# Patient Record
Sex: Male | Born: 1985 | Race: White | Hispanic: No | Marital: Single | Smoking: Current every day smoker
Health system: Southern US, Community
[De-identification: ages and names within clinical notes are randomized; demographics above are authoritative.]

## PROBLEM LIST (undated history)

## (undated) ENCOUNTER — Emergency Department (HOSPITAL_COMMUNITY): Admission: EM | Disposition: A | Discharge status: Home / Self Care | Payer: Self-pay

---

## 2009-05-19 ENCOUNTER — Encounter: Payer: Self-pay | Admitting: Emergency Medicine

## 2009-05-20 ENCOUNTER — Inpatient Hospital Stay (HOSPITAL_COMMUNITY): Admission: EM | Admit: 2009-05-20 | Discharge: 2009-05-20 | Payer: Self-pay | Admitting: Emergency Medicine

## 2009-05-23 ENCOUNTER — Emergency Department (HOSPITAL_COMMUNITY): Admission: EM | Admit: 2009-05-23 | Discharge: 2009-05-24 | Payer: Self-pay | Admitting: Emergency Medicine

## 2009-07-18 ENCOUNTER — Emergency Department (HOSPITAL_COMMUNITY): Admission: EM | Admit: 2009-07-18 | Discharge: 2009-07-19 | Payer: Self-pay | Admitting: Emergency Medicine

## 2010-12-09 LAB — URINALYSIS, ROUTINE W REFLEX MICROSCOPIC
Bilirubin Urine: NEGATIVE
Nitrite: NEGATIVE
Specific Gravity, Urine: <1.005 — ABNORMAL LOW (ref 1.005–1.030)
Urobilinogen, UA: 0.2 mg/dL (ref 0.0–1.0)

## 2010-12-09 LAB — CBC
Hemoglobin: 17.4 g/dL — ABNORMAL HIGH (ref 13.0–17.0)
MCHC: 35.3 g/dL (ref 30.0–36.0)
RBC: 5.02 MIL/uL (ref 4.22–5.81)

## 2010-12-09 LAB — COMPREHENSIVE METABOLIC PANEL
ALT: 20 U/L (ref 0–53)
Alkaline Phosphatase: 50 U/L (ref 39–117)
CO2: 31 mEq/L (ref 19–32)
Calcium: 9.6 mg/dL (ref 8.4–10.5)
GFR calc non Af Amer: >60 mL/min (ref >60)
Glucose, Bld: 93 mg/dL (ref 70–99)
Sodium: 139 mEq/L (ref 135–145)
Total Bilirubin: 0.5 mg/dL (ref 0.3–1.2)

## 2010-12-09 LAB — DIFFERENTIAL
Basophils Absolute: 0.1 10*3/uL (ref 0.0–0.1)
Basophils Relative: 1 % (ref 0–1)
Eosinophils Absolute: 0.1 10*3/uL (ref 0.0–0.7)
Lymphs Abs: 3.7 10*3/uL (ref 0.7–4.0)
Neutrophils Relative %: 45 % (ref 43–77)

## 2010-12-09 LAB — RAPID URINE DRUG SCREEN, HOSP PERFORMED
Barbiturates: NOT DETECTED
Opiates: NOT DETECTED
Tetrahydrocannabinol: POSITIVE — AB

## 2011-03-04 IMAGING — CT CT HEAD W/O CM
1 of 2 series · 13 of 30 positions shown, 17 images · non-contrast
Comparison: 05/20/2009

CLINICAL DATA: Trauma.  Syncopal episodes.  Severe headaches.

CT HEAD WITHOUT CONTRAST
TECHNIQUE: Contiguous axial images were obtained from the base of
the skull through the vertex without contrast.

[Series 2: brain · axial · 0.47mm/px · z∈[+153,+265]mm · 13 of 40 slices shown, 17 images]
[im 3/40  brain]
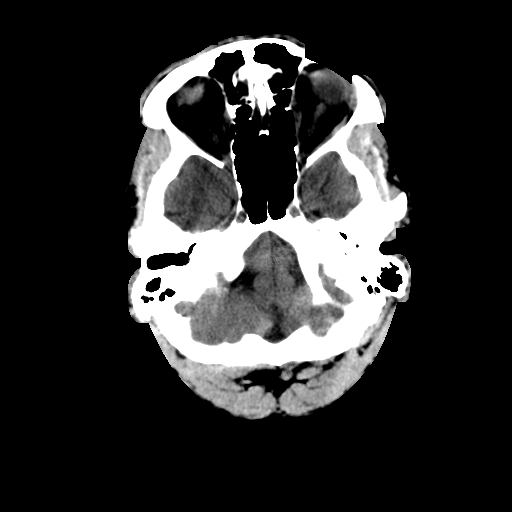
[im 3/40  bone]
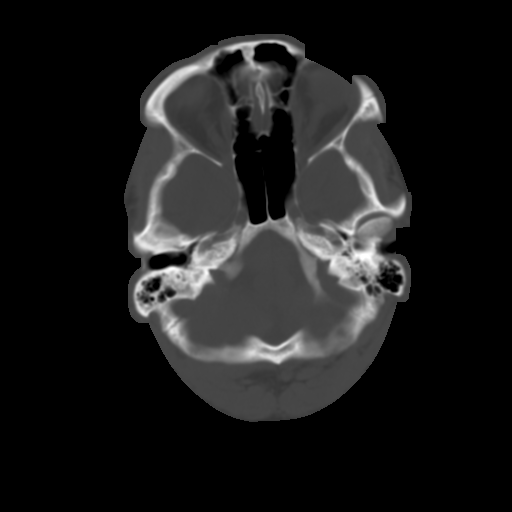
[im 6/40  brain]
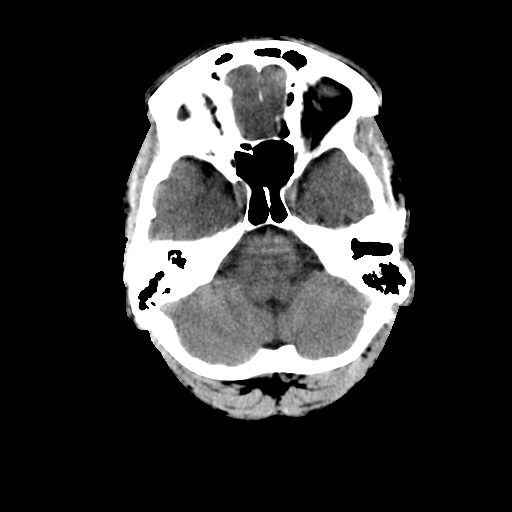
[im 9/40  brain]
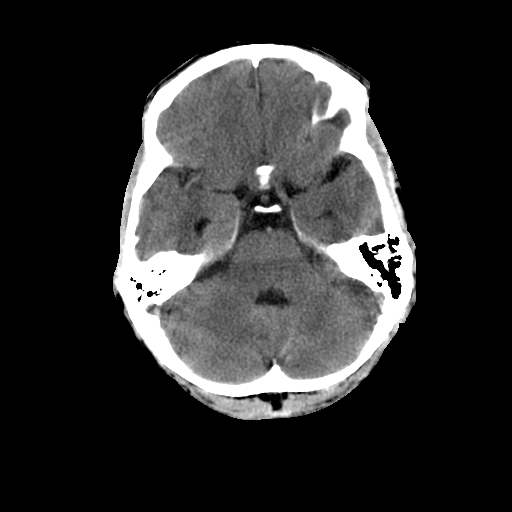
[im 12/40  brain]
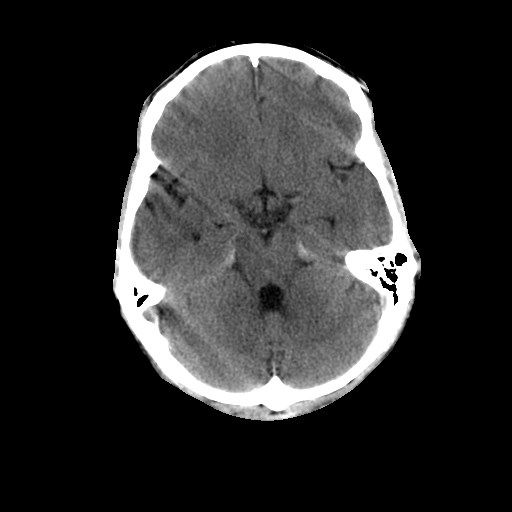
[im 14/40  brain]
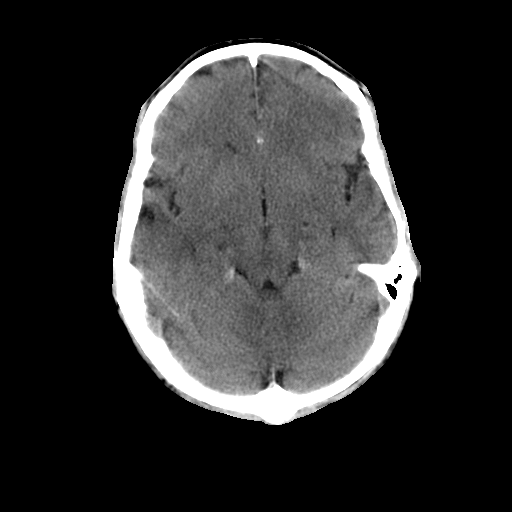
[im 14/40  bone]
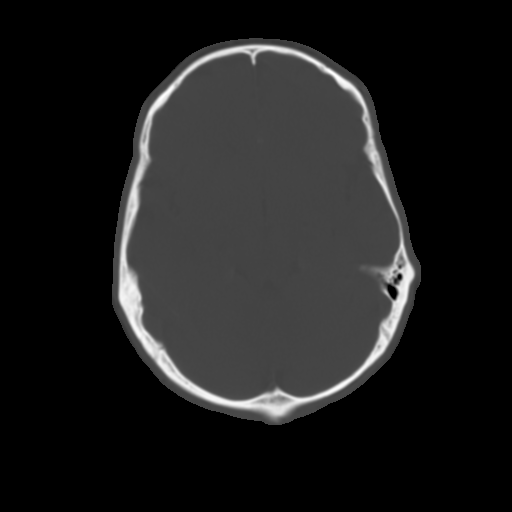
[im 17/40  brain]
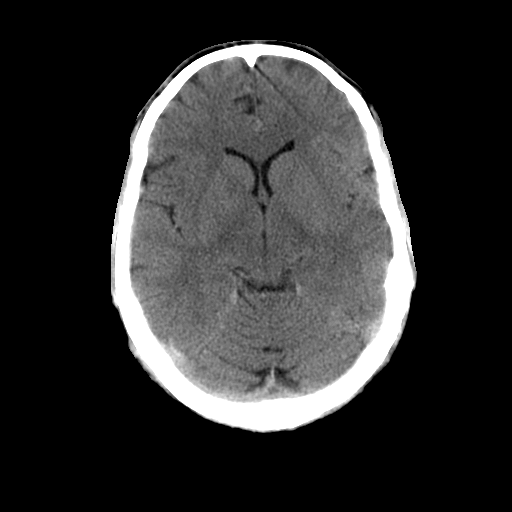
[im 20/40  brain]
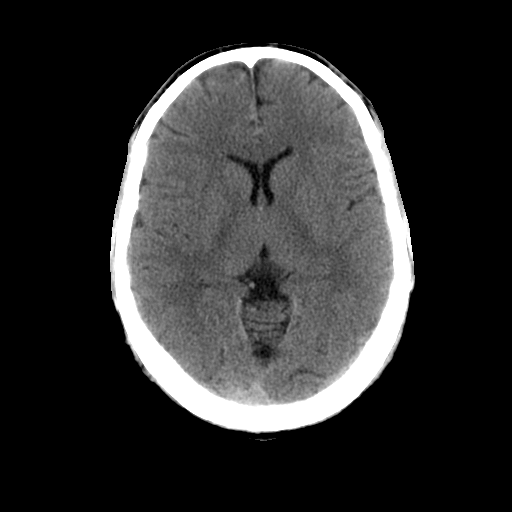
[im 23/40  brain]
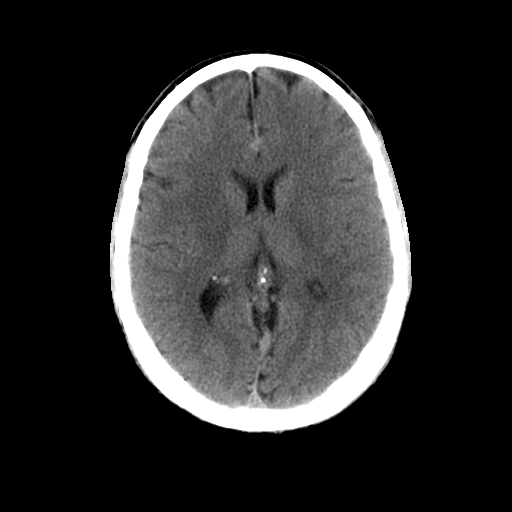
[im 26/40  brain]
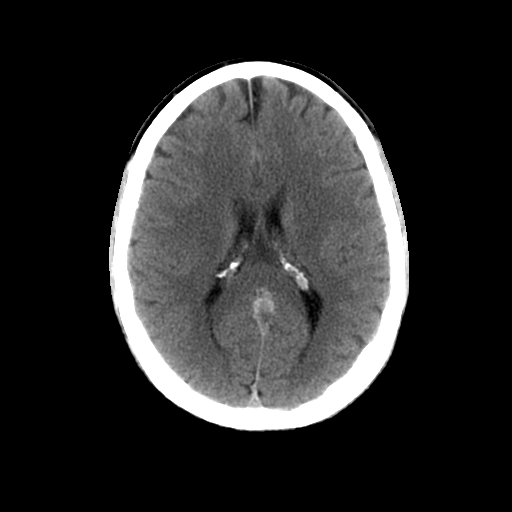
[im 26/40  bone]
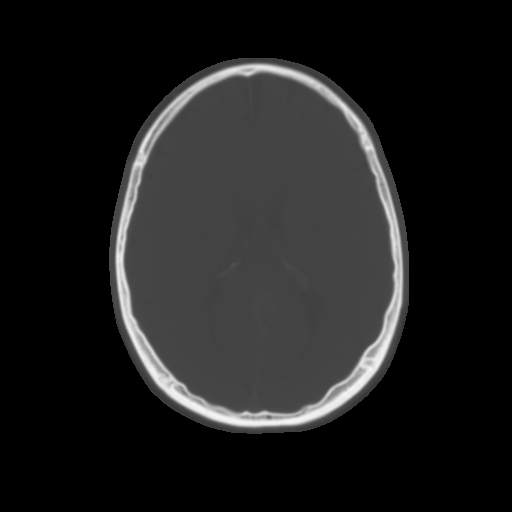
[im 28/40  brain]
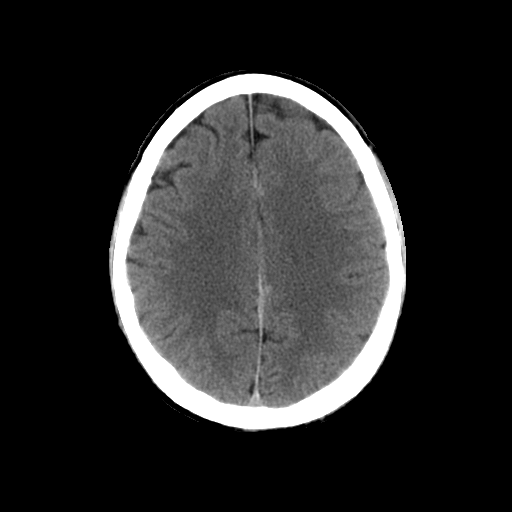
[im 31/40  brain]
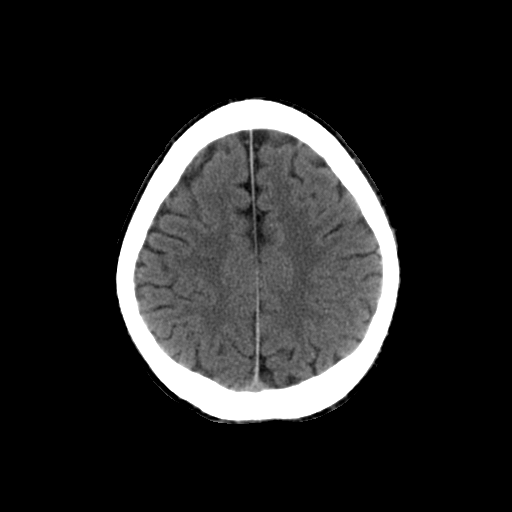
[im 34/40  brain]
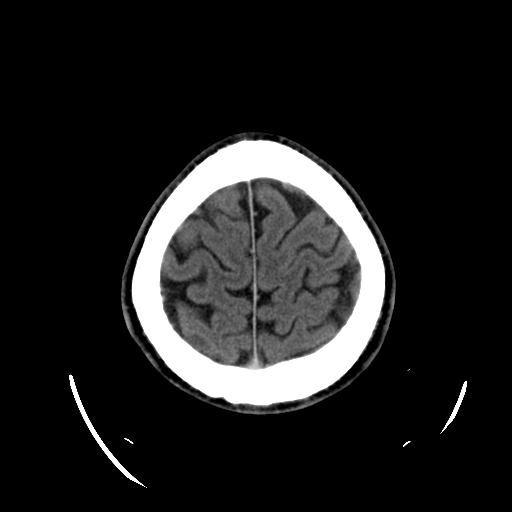
[im 37/40  brain]
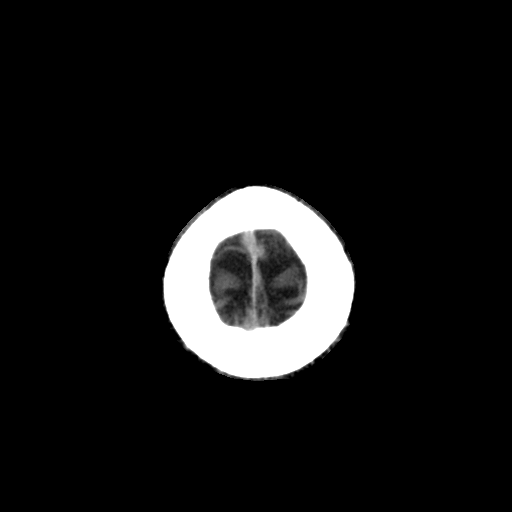
[im 37/40  bone]
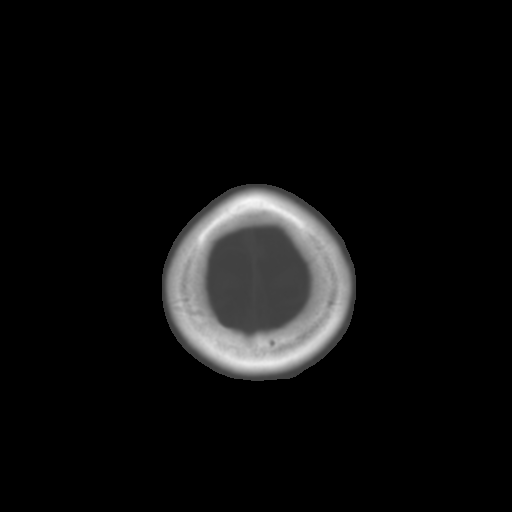

[13 of 30 positions shown; findings below may reference images not displayed]

FINDINGS: The study is mildly degraded by patient motion.  No acute
intracranial abnormalities are present.  Specifically, there is no
evidence for acute infarct, hemorrhage, mass, hydrocephalus, or
extra-axial fluid collection.  There is no significant change in
nodular densities along the anterior cerebral falx.

The paranasal sinuses and mastoid air cells are clear.  The osseous
skull is intact.
IMPRESSION: 1.  Stable nodular densities along the anterior cerebral falx.
Given the stability over time, this may represent prominent to
anterior cerebral artery is rather than blood.  Blood is still not
excluded.
2.  No acute intracranial abnormality or significant interval
change.

## 2021-11-02 ENCOUNTER — Other Ambulatory Visit: Payer: Self-pay

## 2021-11-02 ENCOUNTER — Ambulatory Visit (INDEPENDENT_AMBULATORY_CARE_PROVIDER_SITE_OTHER): Payer: 59 | Admitting: Surgery

## 2021-11-02 ENCOUNTER — Encounter: Payer: Self-pay | Admitting: Surgery

## 2021-11-02 VITALS — BP 128/84 | HR 112 | Temp 97.9°F | Resp 18 | Ht 66.0 in | Wt 147.0 lb

## 2021-11-02 DIAGNOSIS — K6289 Other specified diseases of anus and rectum: Secondary | ICD-10-CM | POA: Diagnosis not present

## 2021-11-02 MED ORDER — AMERICAINE 20 % RE OINT: TOPICAL_OINTMENT | RECTAL | 0 refills | Status: AC | PRN

## 2021-11-02 NOTE — Progress Notes (Signed)
[facility] History and Physical ? ?Reason for Referral: Perianal cyst ?Referring Physician: Bailey Mech, PA ? ?Chief Complaint   ?New Patient (Initial Visit) ?  ? ? ?Richard Mccarty is a 36 y.o. male.  ?HPI: Patient presents with a perianal cyst.  He states that the cyst has been there for at least 2 years, but over the last year it has progressively been enlarging and getting more tender.  He presented to the ED 2 days ago, at which time he underwent a CT of the abdomen and pelvis which demonstrated this exophytic cystic lesion near the junction of the anus and his skin.  At that time, the ED physicians referred him to general surgery for evaluation.  He denies any significant drainage from the area, though he does note a follow-up couple drops of blood on his underwear.  He has significant tenderness associated with the area.  He denies any previous history of hemorrhoids or any other cyst anywhere else on his body.  He denies significant surgical history.  He denies use of blood thinning medications.  He smokes a pack per day of cigarettes and marijuana, and occasionally drinks alcohol. ? ?No past medical history on file. ? ?No family history on file. ? ?Social History  ? ?Tobacco Use  ? Smoking status: Every Day  ?  Types: Cigarettes  ?  Passive exposure: Current  ? Smokeless tobacco: Never  ?Substance Use Topics  ? Alcohol use: Yes  ? ? ?Medications: I have reviewed the patient's current medications. ?Allergies as of 11/02/2021   ?No Known Allergies ?  ? ?  ?Medication List  ?  ? ?  ? Accurate as of November 02, 2021  4:02 PM. If you have any questions, ask your nurse or doctor.  ?  ?  ? ?  ? ?Americaine 20 % rectal ointment ?Generic drug: benzocaine ?Place rectally every 3 (three) hours as needed for pain. ?Started by: Lewie Chamber, DO ?  ?oxyCODONE 5 MG immediate release tablet ?Commonly known as: Oxy IR/ROXICODONE ?Take by mouth. ?  ? ?  ? ? ? ?ROS:  ?Constitutional: negative  for chills, fatigue, and fevers ?Eyes: negative for visual disturbance and pain ?Ears, nose, mouth, throat, and face: negative for ear drainage, sore throat, and sinus problems ?Respiratory: Positive for wheezing, negative for cough, and shortness of breath ?Cardiovascular: negative for chest pain and palpitations ?Gastrointestinal: negative for abdominal pain, nausea, reflux symptoms, and vomiting ?Genitourinary:negative for dysuria, frequency, and urinary retention ?Integument/breast: negative for dryness and rash ?Hematologic/lymphatic: negative for bleeding and lymphadenopathy ?Musculoskeletal:negative for back pain, neck pain, and joint pain ?Neurological: negative for dizziness, tremors, and numbness ?Endocrine: negative for temperature intolerance ? ?Blood pressure 128/84, pulse (!) 112, temperature 97.9 ?F (36.6 ?C), temperature source Other (Comment), resp. rate 18, height 5\' 6"  (1.676 m), weight 147 lb (66.7 kg), SpO2 95 %. ?Physical Exam ?Vitals reviewed.  ?Constitutional:   ?   Appearance: Normal appearance.  ?HENT:  ?   Head: Normocephalic and atraumatic.  ?Eyes:  ?   Extraocular Movements: Extraocular movements intact.  ?   Pupils: Pupils are equal, round, and reactive to light.  ?Cardiovascular:  ?   Rate and Rhythm: Regular rhythm. Tachycardia present.  ?Pulmonary:  ?   Effort: Pulmonary effort is normal.  ?Abdominal:  ?   Palpations: Abdomen is soft.  ?   Tenderness: There is no abdominal tenderness.  ?Genitourinary: ?   Comments: Enlarged exophytic cyst like structure at the 4 o'clock  position, small scab over lesion and small area of ischemia, tender to palpation, appears to arise from the anodermal junction ?Musculoskeletal:     ?   General: Normal range of motion.  ?   Cervical back: Normal range of motion.  ?Skin: ?   General: Skin is warm and dry.  ?Neurological:  ?   General: No focal deficit present.  ?   Mental Status: He is alert and oriented to person, place, and time.  ?Psychiatric:      ?   Mood and Affect: Mood normal.     ?   Behavior: Behavior normal.  ? ? ?Results: ?No results found for this or any previous visit (from the past 48 hour(s)). ? ?No results found. ? ? ?Assessment & Plan:  ?Richard Mccarty is a 36 y.o. male who presents for evaluation of a perianal cyst. ? ?-Given patient's significant tenderness of the associated cyst, recommended anal exam under anesthesia with excision of the cyst structure ?-I explained that I am unsure if this is a perianal cyst versus hemorrhoid versus some other lesion, but we can excise the area and sent to pathology for evaluation ?-The risks and benefits of anal exam under anesthesia with excision of cyst were discussed, including but not limited to bleeding, infection, injury to surrounding structures, fecal incontinence.  After careful consideration, Hosteen Kienast has decided to proceed with this procedure. ?-Patient scheduled for anal exam under anesthesia with excision of cyst on 3/3 ?-Prescription given for Americaine ointment to apply over area to alleviate some of his associated tenderness ? ?All questions were answered to the satisfaction of the patient. ? ? ?Taitum Alms, DO ?[facility] ?[address] ?[place], [place] [address] ?[phone] (office) ? ? ? ? ? ?

## 2021-11-02 NOTE — H&P (Signed)
[facility] History and Physical ? ?Reason for Referral: Perianal cyst ?Referring Physician: Bailey Mech, PA ? ?Chief Complaint   ?New Patient (Initial Visit) ?  ? ? ?Richard Mccarty is a 36 y.o. male.  ?HPI: Patient presents with a perianal cyst.  He states that the cyst has been there for at least 2 years, but over the last year it has progressively been enlarging and getting more tender.  He presented to the ED 2 days ago, at which time he underwent a CT of the abdomen and pelvis which demonstrated this exophytic cystic lesion near the junction of the anus and his skin.  At that time, the ED physicians referred him to general surgery for evaluation.  He denies any significant drainage from the area, though he does note a follow-up couple drops of blood on his underwear.  He has significant tenderness associated with the area.  He denies any previous history of hemorrhoids or any other cyst anywhere else on his body.  He denies significant surgical history.  He denies use of blood thinning medications.  He smokes a pack per day of cigarettes and marijuana, and occasionally drinks alcohol. ? ?No past medical history on file. ? ?No family history on file. ? ?Social History  ? ?Tobacco Use  ? Smoking status: Every Day  ?  Types: Cigarettes  ?  Passive exposure: Current  ? Smokeless tobacco: Never  ?Substance Use Topics  ? Alcohol use: Yes  ? ? ?Medications: I have reviewed the patient's current medications. ?Allergies as of 11/02/2021   ?No Known Allergies ?  ? ?  ?Medication List  ?  ? ?  ? Accurate as of November 02, 2021  4:02 PM. If you have any questions, ask your nurse or doctor.  ?  ?  ? ?  ? ?Americaine 20 % rectal ointment ?Generic drug: benzocaine ?Place rectally every 3 (three) hours as needed for pain. ?Started by: Lewie Chamber, DO ?  ?oxyCODONE 5 MG immediate release tablet ?Commonly known as: Oxy IR/ROXICODONE ?Take by mouth. ?  ? ?  ? ? ? ?ROS:  ?Constitutional: negative  for chills, fatigue, and fevers ?Eyes: negative for visual disturbance and pain ?Ears, nose, mouth, throat, and face: negative for ear drainage, sore throat, and sinus problems ?Respiratory: Positive for wheezing, negative for cough, and shortness of breath ?Cardiovascular: negative for chest pain and palpitations ?Gastrointestinal: negative for abdominal pain, nausea, reflux symptoms, and vomiting ?Genitourinary:negative for dysuria, frequency, and urinary retention ?Integument/breast: negative for dryness and rash ?Hematologic/lymphatic: negative for bleeding and lymphadenopathy ?Musculoskeletal:negative for back pain, neck pain, and joint pain ?Neurological: negative for dizziness, tremors, and numbness ?Endocrine: negative for temperature intolerance ? ?Blood pressure 128/84, pulse (!) 112, temperature 97.9 ?F (36.6 ?C), temperature source Other (Comment), resp. rate 18, height 5\' 6"  (1.676 m), weight 147 lb (66.7 kg), SpO2 95 %. ?Physical Exam ?Vitals reviewed.  ?Constitutional:   ?   Appearance: Normal appearance.  ?HENT:  ?   Head: Normocephalic and atraumatic.  ?Eyes:  ?   Extraocular Movements: Extraocular movements intact.  ?   Pupils: Pupils are equal, round, and reactive to light.  ?Cardiovascular:  ?   Rate and Rhythm: Regular rhythm. Tachycardia present.  ?Pulmonary:  ?   Effort: Pulmonary effort is normal.  ?Abdominal:  ?   Palpations: Abdomen is soft.  ?   Tenderness: There is no abdominal tenderness.  ?Genitourinary: ?   Comments: Enlarged exophytic cyst like structure at the 4 o'clock  position, small scab over lesion and small area of ischemia, tender to palpation, appears to arise from the anodermal junction ?Musculoskeletal:     ?   General: Normal range of motion.  ?   Cervical back: Normal range of motion.  ?Skin: ?   General: Skin is warm and dry.  ?Neurological:  ?   General: No focal deficit present.  ?   Mental Status: He is alert and oriented to person, place, and time.  ?Psychiatric:      ?   Mood and Affect: Mood normal.     ?   Behavior: Behavior normal.  ? ? ?Results: ?No results found for this or any previous visit (from the past 48 hour(s)). ? ?No results found. ? ? ?Assessment & Plan:  ?Richard Mccarty is a 35 y.o. male who presents for evaluation of a perianal cyst. ? ?-Given patient's significant tenderness of the associated cyst, recommended anal exam under anesthesia with excision of the cyst structure ?-I explained that I am unsure if this is a perianal cyst versus hemorrhoid versus some other lesion, but we can excise the area and sent to pathology for evaluation ?-The risks and benefits of anal exam under anesthesia with excision of cyst were discussed, including but not limited to bleeding, infection, injury to surrounding structures, fecal incontinence.  After careful consideration, Richard Mccarty has decided to proceed with this procedure. ?-Patient scheduled for anal exam under anesthesia with excision of cyst on 3/3 ?-Prescription given for Americaine ointment to apply over area to alleviate some of his associated tenderness ? ?All questions were answered to the satisfaction of the patient. ? ? ?Pride Gonzales, DO ?[facility] ?[address] ?[place], [place] [address] ?[phone] (office) ? ? ? ? ? ?

## 2021-11-03 ENCOUNTER — Telehealth (INDEPENDENT_AMBULATORY_CARE_PROVIDER_SITE_OTHER): Payer: 59 | Admitting: Surgery

## 2021-11-03 ENCOUNTER — Encounter (HOSPITAL_COMMUNITY)
Admission: RE | Admit: 2021-11-03 | Discharge: 2021-11-03 | Disposition: A | Discharge status: Home / Self Care | Payer: 59 | Source: Ambulatory Visit | Attending: Surgery | Admitting: Surgery

## 2021-11-03 ENCOUNTER — Other Ambulatory Visit: Payer: Self-pay

## 2021-11-03 ENCOUNTER — Encounter (HOSPITAL_COMMUNITY): Payer: Self-pay

## 2021-11-03 DIAGNOSIS — K6289 Other specified diseases of anus and rectum: Secondary | ICD-10-CM

## 2021-11-03 NOTE — Pre-Procedure Instructions (Signed)
Called for pre-op phone call. Patient states that, " about 0100 last night, whatever this thing is burst. Should I call the doctor and let her know?" I advised him to call her and let her know. We went over pre-op instructions and history in case he does proceed with surgery. ?

## 2021-11-03 NOTE — Telephone Encounter (Signed)
Was informed by office staff that the patient called the office and stated that his perianal mass had ruptured last night.  Called the patient to discuss.  He stated that over the few hours after he left, he felt that the area was getting bigger and the pain was unrelenting, so his mother was coming to pick him up to take him to the emergency department.  He states that after his mother arrived, he sat down and had relief of his pain.  He evaluated the mass, and states that had ruptured and had a thick white drainage that came out.  He still has the overlying skin hanging from his anus.  I discussed that I believe we should still proceed with his anal exam under anesthesia and I will plan to remove what ever remains of this ruptured cyst/mass.  We we will plan to send the remnants of the cyst/mass to pathology for evaluation and to try to prevent recurrence.  He was agreeable to this plan.  All of his questions were answered to his expressed satisfaction. ?

## 2021-11-03 NOTE — Patient Instructions (Signed)
Richard Mccarty  11/03/2021     @PREFPERIOPPHARMACY @   Your procedure is scheduled on  11/04/2021.   Report to [facility] at  1100 A.M.   Call this number if you have problems the morning of surgery:  [phone]   Remember:  Do not eat or drink after midnight.      Take these medicines the morning of surgery with A SIP OF WATER                                 oxy IR (if needed).    Do not wear jewelry, make-up or nail polish.  Do not wear lotions, powders, or perfumes, or deodorant.  Do not shave 48 hours prior to surgery.  Men may shave face and neck.  Do not bring valuables to the hospital.  [facility] is not responsible for any belongings or valuables.  Contacts, dentures or bridgework may not be worn into surgery.  Leave your suitcase in the car.  After surgery it may be brought to your room.  For patients admitted to the hospital, discharge time will be determined by your treatment team.  Patients discharged the day of surgery will not be allowed to drive home and must have someone with them for 24 hours.    Special instructions:   DO NOT smoke tobacco or vape for 24 hours before your procedure.  Please read over the following fact sheets that you were given. Coughing and Deep Breathing, Surgical Site Infection Prevention, Anesthesia Post-op Instructions, and Care and Recovery After Surgery      Incision Care, Adult An incision is a cut that a doctor makes in your skin for surgery. Most times, these cuts are closed after surgery. Your cut from surgery may be closed with: Stitches (sutures). Staples. Skin glue. Skin tape (adhesive) strips. You may need to go back to your doctor to have stitches or staples taken out. This may happen many days or many weeks after your surgery. You need to take good care of your cut so it does not get infected. Follow instructions from your doctor about how to care for your cut. Supplies needed: Soap and water. A  clean hand towel. Wound cleanser. A clean bandage (dressing), if needed. Cream or ointment, if told by your doctor. Clean gauze. How to care for your cut from surgery Cleaning your cut Ask your doctor how to clean your cut. You may need to: Wear medical gloves. Use mild soap and water, or a wound cleanser. Use a clean gauze to pat your cut dry after you clean it. Changing your bandage Wash your hands with soap and water for at least 20 seconds before and after you change your bandage. If you cannot use soap and water, use hand sanitizer. Do not usedisinfectants or antiseptics, such as rubbing alcohol, to clean your wound unless told by your doctor. Change your bandage as told by your doctor. Leavestitches or skin glue in place for at least 2 weeks. Leave tape strips alone unless you are told to take them off. You may trim the edges of the tape strips if they curl up. Put a cream or ointment on your cut. Do this only as told. Cover your cut with a clean bandage. Ask your doctor when you can leave your cut uncovered. Checking for infection Check your cut area every day for signs of infection. Check for:  More redness, swelling, or pain. More fluid or blood. New warmth. Hardness or a new rash around the incision. Pus or a bad smell.  Follow these instructions at home Medicines Take over-the-counter and prescription medicines only as told by your doctor. If you were prescribed an antibiotic medicine, cream, or ointment, use it as told by your doctor. Do not stop using the antibiotic even if you start to feel better. Eating and drinking Eat foods that have a lot of certain nutrients, such as protein, vitamin A, and vitamin C. These foods help your cut heal. Foods rich in protein include meat, fish, eggs, dairy, beans, nuts, and protein drinks. Foods rich in vitamin A include carrots and dark green, leafy vegetables. Foods rich in vitamin C include citrus fruits, tomatoes, broccoli, and  peppers. Drink enough fluid to keep your pee (urine) pale yellow. General instructions  Do not take baths, swim, or use a hot tub. Ask your doctor about taking showers or sponge baths. Limit movement around your cut. This helps with healing. Try not to strain, lift, or exercise for the first 2 weeks, or for as long as told by your doctor. Return to your normal activities as told by your doctor. Ask your doctor what activities are safe for you. Do not scratch, scrub, or pick at your cut. Keep it covered as told by your doctor. Protect your cut from the sun when you are outside for the first 6 months, or for as long as told by your doctor. Cover up the scar area or put on sunscreen that has an SPF of at least 30. Do not use any products that contain nicotine or tobacco, such as cigarettes, e-cigarettes, and chewing tobacco. These can delay cut healing. If you need help quitting, ask your doctor. Keep all follow-up visits. Contact a doctor if: You have any of these signs of infection around your cut: More redness, swelling, or pain. More fluid or blood. New warmth or hardness. Pus or a bad smell. A new rash. You have a fever. You feel like you may vomit (nauseous). You vomit. You are dizzy. Your stitches, staples, skin glue, or tape strips come undone. Your cut gets bigger. You have a fever. Get help right away if: Your cut bleeds through your bandage, and bleeding does not stop with gentle pressure. Your cut opens up and comes apart. These symptoms may be an emergency. Do not wait to see if the symptoms will go away. Get medical help right away. Call your local emergency services (911 in the [place] Do not drive yourself to the hospital. Summary Follow instructions from your doctor about how to care for your cut. Wash your hands with soap and water for at least 20 seconds before and after you change your bandage. If you cannot use soap and water, use hand sanitizer. Check your cut area  every day for signs of infection. Keep all follow-up visits. This information is not intended to replace advice given to you by your health care provider. Make sure you discuss any questions you have with your health care provider. Document Revised: 11/22/2020 Document Reviewed: 11/22/2020 Elsevier Patient Education  2022 [employer] Monitored Anesthesia Care, Care After This sheet gives you information about how to care for yourself after your procedure. Your health care provider may also give you more specific instructions. If you have problems or questions, contact your health care provider. What can I expect after the procedure? After the procedure, it is common to have: Tiredness.  Forgetfulness about what happened after the procedure. Impaired judgment for important decisions. Nausea or vomiting. Some difficulty with balance. Follow these instructions at home: For the time period you were told by your health care provider:   Rest as needed. Do not participate in activities where you could fall or become injured. Do not drive or use machinery. Do not drink alcohol. Do not take sleeping pills or medicines that cause drowsiness. Do not make important decisions or sign legal documents. Do not take care of children on your own. Eating and drinking Follow the diet that is recommended by your health care provider. Drink enough fluid to keep your urine pale yellow. If you vomit: Drink water, juice, or soup when you can drink without vomiting. Make sure you have little or no nausea before eating solid foods. General instructions Have a responsible adult stay with you for the time you are told. It is important to have someone help care for you until you are awake and alert. Take over-the-counter and prescription medicines only as told by your health care provider. If you have sleep apnea, surgery and certain medicines can increase your risk for breathing problems. Follow instructions  from your health care provider about wearing your sleep device: Anytime you are sleeping, including during daytime naps. While taking prescription pain medicines, sleeping medicines, or medicines that make you drowsy. Avoid smoking. Keep all follow-up visits as told by your health care provider. This is important. Contact a health care provider if: You keep feeling nauseous or you keep vomiting. You feel light-headed. You are still sleepy or having trouble with balance after 24 hours. You develop a rash. You have a fever. You have redness or swelling around the IV site. Get help right away if: You have trouble breathing. You have new-onset confusion at home. Summary For several hours after your procedure, you may feel tired. You may also be forgetful and have poor judgment. Have a responsible adult stay with you for the time you are told. It is important to have someone help care for you until you are awake and alert. Rest as told. Do not drive or operate machinery. Do not drink alcohol or take sleeping pills. Get help right away if you have trouble breathing, or if you suddenly become confused. This information is not intended to replace advice given to you by your health care provider. Make sure you discuss any questions you have with your health care provider. Document Revised: 05/06/2020 Document Reviewed: 07/24/2019 Elsevier Patient Education  2022 [employer].

## 2021-11-04 ENCOUNTER — Encounter (HOSPITAL_COMMUNITY)
Admission: RE | Disposition: A | Discharge status: Home / Self Care | Payer: Self-pay | Source: Home / Self Care | Attending: Surgery

## 2021-11-04 ENCOUNTER — Ambulatory Visit (HOSPITAL_COMMUNITY): Payer: 59 | Admitting: Anesthesiology

## 2021-11-04 ENCOUNTER — Other Ambulatory Visit: Payer: Self-pay

## 2021-11-04 ENCOUNTER — Encounter (HOSPITAL_COMMUNITY): Payer: Self-pay | Admitting: Surgery

## 2021-11-04 ENCOUNTER — Ambulatory Visit (HOSPITAL_COMMUNITY)
Admission: RE | Admit: 2021-11-04 | Discharge: 2021-11-04 | Disposition: A | Discharge status: Home / Self Care | Payer: 59 | Attending: Surgery | Admitting: Surgery

## 2021-11-04 ENCOUNTER — Ambulatory Visit (HOSPITAL_BASED_OUTPATIENT_CLINIC_OR_DEPARTMENT_OTHER): Payer: 59 | Admitting: Anesthesiology

## 2021-11-04 DIAGNOSIS — F17210 Nicotine dependence, cigarettes, uncomplicated: Secondary | ICD-10-CM | POA: Insufficient documentation

## 2021-11-04 DIAGNOSIS — F1290 Cannabis use, unspecified, uncomplicated: Secondary | ICD-10-CM | POA: Insufficient documentation

## 2021-11-04 DIAGNOSIS — K6289 Other specified diseases of anus and rectum: Secondary | ICD-10-CM | POA: Diagnosis not present

## 2021-11-04 DIAGNOSIS — L0591 Pilonidal cyst without abscess: Secondary | ICD-10-CM | POA: Diagnosis not present

## 2021-11-04 HISTORY — PX: MASS EXCISION: SHX2000

## 2021-11-04 SURGERY — EXAM UNDER ANESTHESIA
Anesthesia: General | Site: Rectum

## 2021-11-04 MED ORDER — BACITRACIN-NEOMYCIN-POLYMYXIN 400-5-5000 EX OINT
TOPICAL_OINTMENT | CUTANEOUS | Status: AC
  Filled 2021-11-04: qty 1

## 2021-11-04 MED ORDER — LIDOCAINE HCL (PF) 2 % IJ SOLN
INTRAMUSCULAR | Status: AC
  Filled 2021-11-04: qty 5

## 2021-11-04 MED ORDER — IBUPROFEN 200 MG PO TABS: 200.0000 mg | ORAL_TABLET | Freq: Four times a day (QID) | ORAL | 0 refills | Status: AC

## 2021-11-04 MED ORDER — METRONIDAZOLE 500 MG/100ML IV SOLN
500.0000 mg | INTRAVENOUS | Status: AC
  Administered 2021-11-04: 500 mg via INTRAVENOUS

## 2021-11-04 MED ORDER — DEXMEDETOMIDINE (PRECEDEX) IN NS 20 MCG/5ML (4 MCG/ML) IV SYRINGE
PREFILLED_SYRINGE | INTRAVENOUS | Status: DC | PRN
  Administered 2021-11-04: 8 ug via INTRAVENOUS

## 2021-11-04 MED ORDER — METRONIDAZOLE 500 MG/100ML IV SOLN
INTRAVENOUS | Status: AC
  Filled 2021-11-04: qty 100

## 2021-11-04 MED ORDER — SUCCINYLCHOLINE CHLORIDE 200 MG/10ML IV SOSY
PREFILLED_SYRINGE | INTRAVENOUS | Status: DC | PRN
  Administered 2021-11-04: 120 mg via INTRAVENOUS

## 2021-11-04 MED ORDER — ONDANSETRON HCL 4 MG/2ML IJ SOLN
INTRAMUSCULAR | Status: DC | PRN
  Administered 2021-11-04: 4 mg via INTRAVENOUS

## 2021-11-04 MED ORDER — CHLORHEXIDINE GLUCONATE CLOTH 2 % EX PADS: 6.0000 | MEDICATED_PAD | Freq: Once | CUTANEOUS | Status: DC

## 2021-11-04 MED ORDER — ONDANSETRON HCL 4 MG/2ML IJ SOLN
INTRAMUSCULAR | Status: AC
  Filled 2021-11-04: qty 4

## 2021-11-04 MED ORDER — LIDOCAINE 2% (20 MG/ML) 5 ML SYRINGE
INTRAMUSCULAR | Status: DC | PRN
  Administered 2021-11-04: 60 mg via INTRAVENOUS

## 2021-11-04 MED ORDER — ROCURONIUM BROMIDE 10 MG/ML (PF) SYRINGE
PREFILLED_SYRINGE | INTRAVENOUS | Status: AC
  Filled 2021-11-04: qty 10

## 2021-11-04 MED ORDER — ACETAMINOPHEN 500 MG PO TABS: 1000.0000 mg | ORAL_TABLET | Freq: Four times a day (QID) | ORAL | 0 refills | Status: AC

## 2021-11-04 MED ORDER — OXYCODONE HCL 5 MG PO TABS
5.0000 mg | ORAL_TABLET | Freq: Four times a day (QID) | ORAL | 0 refills | Status: AC | PRN
Start: 2021-11-04 — End: ?

## 2021-11-04 MED ORDER — METOCLOPRAMIDE HCL 5 MG/ML IJ SOLN
INTRAMUSCULAR | Status: AC
  Filled 2021-11-04: qty 2

## 2021-11-04 MED ORDER — ORAL CARE MOUTH RINSE: 15.0000 mL | Freq: Once | OROMUCOSAL | Status: AC

## 2021-11-04 MED ORDER — SUCCINYLCHOLINE CHLORIDE 200 MG/10ML IV SOSY
PREFILLED_SYRINGE | INTRAVENOUS | Status: AC
  Filled 2021-11-04: qty 10

## 2021-11-04 MED ORDER — FENTANYL CITRATE (PF) 100 MCG/2ML IJ SOLN
INTRAMUSCULAR | Status: AC
  Filled 2021-11-04: qty 2

## 2021-11-04 MED ORDER — BUPIVACAINE LIPOSOME 1.3 % IJ SUSP
INTRAMUSCULAR | Status: AC
  Filled 2021-11-04: qty 20

## 2021-11-04 MED ORDER — ALBUTEROL SULFATE HFA 108 (90 BASE) MCG/ACT IN AERS
INHALATION_SPRAY | RESPIRATORY_TRACT | Status: DC | PRN
  Administered 2021-11-04: 2 via RESPIRATORY_TRACT

## 2021-11-04 MED ORDER — SUGAMMADEX SODIUM 200 MG/2ML IV SOLN
INTRAVENOUS | Status: DC | PRN
  Administered 2021-11-04: 200 mg via INTRAVENOUS

## 2021-11-04 MED ORDER — MIDAZOLAM HCL 5 MG/5ML IJ SOLN
INTRAMUSCULAR | Status: DC | PRN
  Administered 2021-11-04: 2 mg via INTRAVENOUS

## 2021-11-04 MED ORDER — BUPIVACAINE LIPOSOME 1.3 % IJ SUSP
INTRAMUSCULAR | Status: DC | PRN
  Administered 2021-11-04: 20 mL

## 2021-11-04 MED ORDER — DEXMEDETOMIDINE (PRECEDEX) IN NS 20 MCG/5ML (4 MCG/ML) IV SYRINGE
PREFILLED_SYRINGE | INTRAVENOUS | Status: AC
  Filled 2021-11-04: qty 5

## 2021-11-04 MED ORDER — DEXAMETHASONE SODIUM PHOSPHATE 10 MG/ML IJ SOLN
INTRAMUSCULAR | Status: DC | PRN
  Administered 2021-11-04: 5 mg via INTRAVENOUS

## 2021-11-04 MED ORDER — ROCURONIUM BROMIDE 10 MG/ML (PF) SYRINGE
PREFILLED_SYRINGE | INTRAVENOUS | Status: DC | PRN
  Administered 2021-11-04: 30 mg via INTRAVENOUS

## 2021-11-04 MED ORDER — HYDROMORPHONE HCL 1 MG/ML IJ SOLN
INTRAMUSCULAR | Status: AC
  Filled 2021-11-04: qty 0.5

## 2021-11-04 MED ORDER — CHLORHEXIDINE GLUCONATE 0.12 % MT SOLN
15.0000 mL | Freq: Once | OROMUCOSAL | Status: AC
  Administered 2021-11-04: 15 mL via OROMUCOSAL

## 2021-11-04 MED ORDER — MEPERIDINE HCL 50 MG/ML IJ SOLN: 6.2500 mg | INTRAMUSCULAR | Status: DC | PRN

## 2021-11-04 MED ORDER — DOCUSATE SODIUM 100 MG PO CAPS: 100.0000 mg | ORAL_CAPSULE | Freq: Two times a day (BID) | ORAL | 0 refills | Status: AC

## 2021-11-04 MED ORDER — CEFAZOLIN SODIUM-DEXTROSE 2-4 GM/100ML-% IV SOLN
INTRAVENOUS | Status: AC
  Filled 2021-11-04: qty 100

## 2021-11-04 MED ORDER — PROPOFOL 10 MG/ML IV BOLUS
INTRAVENOUS | Status: DC | PRN
  Administered 2021-11-04: 160 mg via INTRAVENOUS

## 2021-11-04 MED ORDER — METOCLOPRAMIDE HCL 5 MG/ML IJ SOLN
10.0000 mg | Freq: Once | INTRAMUSCULAR | Status: AC
  Administered 2021-11-04: 10 mg via INTRAVENOUS

## 2021-11-04 MED ORDER — FENTANYL CITRATE (PF) 100 MCG/2ML IJ SOLN
INTRAMUSCULAR | Status: DC | PRN
  Administered 2021-11-04: 100 ug via INTRAVENOUS

## 2021-11-04 MED ORDER — MIDAZOLAM HCL 2 MG/2ML IJ SOLN
INTRAMUSCULAR | Status: AC
  Filled 2021-11-04: qty 2

## 2021-11-04 MED ORDER — CEFAZOLIN SODIUM-DEXTROSE 2-4 GM/100ML-% IV SOLN
2.0000 g | INTRAVENOUS | Status: AC
  Administered 2021-11-04: 2 g via INTRAVENOUS

## 2021-11-04 MED ORDER — PROPOFOL 10 MG/ML IV BOLUS
INTRAVENOUS | Status: AC
  Filled 2021-11-04: qty 20

## 2021-11-04 MED ORDER — HYDROMORPHONE HCL 1 MG/ML IJ SOLN
0.2500 mg | INTRAMUSCULAR | Status: DC | PRN
  Administered 2021-11-04: 0.5 mg via INTRAVENOUS

## 2021-11-04 MED ORDER — DEXAMETHASONE SODIUM PHOSPHATE 10 MG/ML IJ SOLN
INTRAMUSCULAR | Status: AC
  Filled 2021-11-04: qty 1

## 2021-11-04 MED ORDER — BUPIVACAINE HCL (PF) 0.5 % IJ SOLN
INTRAMUSCULAR | Status: AC
  Filled 2021-11-04: qty 30

## 2021-11-04 MED ORDER — 0.9 % SODIUM CHLORIDE (POUR BTL) OPTIME
TOPICAL | Status: DC | PRN
  Administered 2021-11-04: 1000 mL

## 2021-11-04 MED ORDER — LACTATED RINGERS IV SOLN: INTRAVENOUS | Status: DC

## 2021-11-04 MED ORDER — ONDANSETRON HCL 4 MG/2ML IJ SOLN: 4.0000 mg | Freq: Once | INTRAMUSCULAR | Status: DC | PRN

## 2021-11-04 SURGICAL SUPPLY — 30 items
APL PRP STRL LF ISPRP CHG 10.5 (MISCELLANEOUS) ×1
APPLICATOR CHLORAPREP 10.5 ORG (MISCELLANEOUS) ×2 IMPLANT
CLOTH BEACON ORANGE TIMEOUT ST (SAFETY) ×2 IMPLANT
COVER LIGHT HANDLE STERIS (MISCELLANEOUS) ×4 IMPLANT
DECANTER SPIKE VIAL GLASS SM (MISCELLANEOUS) ×2 IMPLANT
DRAPE HALF SHEET 40X57 (DRAPES) ×1 IMPLANT
DRSG ADAPTIC 3X8 NADH LF (GAUZE/BANDAGES/DRESSINGS) ×1 IMPLANT
ELECT REM PT RETURN 9FT ADLT (ELECTROSURGICAL) ×2
ELECTRODE REM PT RTRN 9FT ADLT (ELECTROSURGICAL) ×1 IMPLANT
GLOVE SURG ENC MOIS LTX SZ6.5 (GLOVE) ×2 IMPLANT
GLOVE SURG UNDER POLY LF SZ7 (GLOVE) ×4 IMPLANT
GOWN STRL REUS W/TWL LRG LVL3 (GOWN DISPOSABLE) ×4 IMPLANT
KIT TURNOVER KIT A (KITS) ×2 IMPLANT
MANIFOLD NEPTUNE II (INSTRUMENTS) ×2 IMPLANT
NDL HYPO 21X1.5 SAFETY (NEEDLE) IMPLANT
NEEDLE HYPO 21X1.5 SAFETY (NEEDLE) ×2 IMPLANT
NS IRRIG 1000ML POUR BTL (IV SOLUTION) ×2 IMPLANT
PACK MINOR (CUSTOM PROCEDURE TRAY) ×1 IMPLANT
PAD ARMBOARD 7.5X6 YLW CONV (MISCELLANEOUS) ×2 IMPLANT
SET BASIN LINEN APH (SET/KITS/TRAYS/PACK) ×2 IMPLANT
SHEET LAVH (DRAPES) ×1 IMPLANT
SURGILUBE 2OZ TUBE FLIPTOP (MISCELLANEOUS) ×1 IMPLANT
SUT CHROMIC 3 0 SH 27 (SUTURE) ×1 IMPLANT
SUT ETHILON 3 0 FSL (SUTURE) IMPLANT
SUT MNCRL AB 4-0 PS2 18 (SUTURE) ×1 IMPLANT
SUT PROLENE 4 0 PS 2 18 (SUTURE) IMPLANT
SUT VIC AB 3-0 SH 27 (SUTURE)
SUT VIC AB 3-0 SH 27X BRD (SUTURE) IMPLANT
SYR 20ML LL LF (SYRINGE) ×1 IMPLANT
SYR CONTROL 10ML LL (SYRINGE) ×2 IMPLANT

## 2021-11-04 NOTE — Progress Notes (Signed)
Update Note: ? ?Called patient's mother, Herbert Seta, to update him after the surgery.  It was explained that his surgery went well, and I was able to remove his perianal mass without issue.  It was not involving his anus, sphincter, and did not communicate with the rectum.  I explained that he can be active, and will only be limited by pain.  He can apply americaine ointment around the incision site, and take his roxicodone for pain.  I have given him a prescription for colace to keep his bowel movements soft.  All questions were answered to her expressed satisfaction. ? ?Graciella Freer, DO ?[facility] ?[place], [place] [address] ?[phone] (office) ? ?

## 2021-11-04 NOTE — Anesthesia Preprocedure Evaluation (Addendum)
Anesthesia Evaluation  ?Patient identified by MRN, date of birth, ID band ?Patient awake ? ? ? ?Reviewed: ?Allergy & Precautions, NPO status , Patient's Chart, lab work & pertinent test results ? ?Airway ?Mallampati: II ? ?TM Distance: >3 FB ?Neck ROM: Full ? ? ? Dental ? ?(+) Dental Advisory Given, Chipped, Poor Dentition ?Multiple chipped teeth:   ?Pulmonary ?Current SmokerPatient did not abstain from smoking.,  ?  ?Pulmonary exam normal ?breath sounds clear to auscultation ? ? ? ? ? ? Cardiovascular ?negative cardio ROS ?Normal cardiovascular exam ?Rhythm:Regular Rate:Normal ? ? ?  ?Neuro/Psych ?negative neurological ROS ? negative psych ROS  ? GI/Hepatic ?negative GI ROS, (+)  ?  ? substance abuse ? marijuana use,   ?Endo/Other  ?negative endocrine ROS ? Renal/GU ?negative Renal ROS  ?negative genitourinary ?  ?Musculoskeletal ?negative musculoskeletal ROS ?(+)  ? Abdominal ?  ?Peds ?negative pediatric ROS ?(+)  Hematology ?negative hematology ROS ?(+)   ?Anesthesia Other Findings ? ? Reproductive/Obstetrics ?negative OB ROS ? ?  ? ? ? ? ? ? ? ? ? ? ? ? ? ?  ?  ? ? ? ? ? ? ?Anesthesia Physical ?Anesthesia Plan ? ?ASA: 2 ? ?Anesthesia Plan: General  ? ?Post-op Pain Management: Dilaudid IV  ? ?Induction: Intravenous and Rapid sequence ? ?PONV Risk Score and Plan: 2 and Ondansetron, Dexamethasone and Metaclopromide ? ?Airway Management Planned: Oral ETT ? ?Additional Equipment:  ? ?Intra-op Plan:  ? ?Post-operative Plan: Extubation in OR ? ?Informed Consent: I have reviewed the patients History and Physical, chart, labs and discussed the procedure including the risks, benefits and alternatives for the proposed anesthesia with the patient or authorized representative who has indicated his/her understanding and acceptance.  ? ? ? ?Dental advisory given ? ?Plan Discussed with: CRNA and Surgeon ? ?Anesthesia Plan Comments:   ? ? ? ?Anesthesia Quick Evaluation ? ?

## 2021-11-04 NOTE — Interval H&P Note (Signed)
History and Physical Interval Note: ? ?11/04/2021 ?1:08 PM ? ?Richard Mccarty  has presented today for surgery, with the diagnosis of Anal Cyst.  The various methods of treatment have been discussed with the patient and family. After consideration of risks, benefits and other options for treatment, the patient has consented to  Procedure(s): ?EXAM UNDER ANESTHESIA ANAL (N/A) ?EXCISION PERIANAL MASS (N/A) as a surgical intervention.  The patient's history has been reviewed, patient examined, no change in status, stable for surgery.  I have reviewed the patient's chart and labs.  Questions were answered to the patient's satisfaction.   ? ? ?Richard Mccarty ? ? ?

## 2021-11-04 NOTE — Op Note (Addendum)
[facility] ?Operative Note ? ?11/04/21 ? ?Preoperative Diagnosis: Perianal mass ?  ?Postoperative Diagnosis: Same ?  ?Procedure(s) Performed: Anal exam under anesthesia, Excision of perianal mass ?  ?Surgeon: Theophilus Kinds, DO  ?  ?Assistants: No qualified resident was available  ?  ?Anesthesia: General endotracheal ?  ?Anesthesiologist: Molli Barrows, MD  ?  ?Specimens: Perianal mass ?  ?Estimated Blood Loss: Minimal ?  ?Blood Replacement: None  ?  ?Complications: None  ? ?Wound Class: Dirty ?  ?Operative Indications: Patient is a 36 year old male who presents for anal exam under anesthesia with perianal mass excision.  He states that the mass has been present for 2 years, but over the last year has been increasing in size and he has significant pain associated with it.  He would like it removed at this time. ? ?All risks, benefits, and alternatives to anal exam under anesthesia and excision of perianal mass were discussed with the patient, all of his questions were answered to his expressed satisfaction. The patient expresses he wishes to proceed, and informed consent was obtained. ? ?Findings: Broad-based perianal cystic mass, no involvement of sphincter or anus ?  ?Procedure: The patient was taken to the operating room and placed supine. General endotracheal anesthesia was induced. Intravenous antibiotics were administered per protocol.  The patient was positioned in lithotomy.  The perineum was prepped and draped in standard sterile fashion.  Digital rectal exam was performed without any palpable abnormalities.  An anoscope was introduced, and no fistulous tracts were noted.  Patient did have internal hemorrhoids noted.   ? ?Attention was then turned to the perianal mass at the 7 o'clock position.  The skin was cut in an elliptical/circular fashion along the base of the perianal mass.  Using electrocautery, the mass was able to be dissected from the underlying subcutaneous  tissue.  The mass was not involving the anal sphincter.  Hemostasis was obtained.  The incision was loosely closed with 3-0 chromic in an interrupted fashion.  A pudendal nerve block was performed with Exparel, and a small amount was injected around the incision site.  The incision was dressed with bacitracin, Telfa, Medipore tape, and briefs. ? ?All counts were correct at the end of the case. The patient was awakened from anesthesia and extubated without complication.  The patient went to the PACU in stable condition. ?  ?Theophilus Kinds, DO  ?[facility] ?[address] ?[place], [place] [address] ?[phone] (office) ? ? ?

## 2021-11-04 NOTE — Discharge Instructions (Addendum)
Ambulatory Surgery Discharge Instructions ? ?General Anesthesia or Sedation ?Do not drive or operate heavy machinery for 24 hours.  ?Do not consume alcohol, tranquilizers, sleeping medications, or any non-prescribed medications for 24 hours. ?Do not make important decisions or sign any important papers in the next 24 hours. ?You should have someone with you tonight at home. ? ?Activity ? You are advised to go directly home from the hospital.  Restrict your activities and rest for a day.  Resume light activity tomorrow. Perform sitz baths after bowel movements to keep self and incision clean.  Use wet wipes after bowel movements. ? ?Fluids and Diet ?Begin with clear liquids, bouillon, dry toast, soda crackers.  If not nauseated, you may go to a regular diet when you desire.  Greasy and spicy foods are not advised. ? ?Medications ? If you have not had a bowel movement in 24 hours, take 2 tablespoons over the counter Milk of mag. ?            You May resume your blood thinners tomorrow (Aspirin, coumadin, or other).  ?You are being discharged with prescriptions for Opioid/Narcotic Medications: There are some specific considerations for these medications that you should know. ?Opioid Meds have risks & benefits. Addiction to these meds is always a concern with prolonged use ?Take medication only as directed ?Do not drive while taking narcotic pain medication ?Do not crush tablets or capsules ?Do not use a different container than medication was dispensed in ?Lock the container of medication in a cool, dry place out of reach of children and pets. ?Opioid medication can cause addiction ?Do not share with anyone else (this is a felony) ?Do not store medications for future use. Dispose of them properly. ?    Disposal:  ?Find a [employer] household drug take back site near you.  ?If you can't get to a drug take back site, use the recipe below as a last resort to dispose of expired, unused or unwanted drugs. ?Disposal   ?(Do not dispose chemotherapy drugs this way, talk to your prescribing doctor instead.) Step 1: Mix drugs (do not crush) with dirt, kitty litter, or used coffee grounds and add a small amount of water to dissolve any solid medications. Step 2: Seal drugs in plastic bag. Step 3: Place plastic bag in trash. Step 4: Take prescription container and scratch out personal information, then recycle or throw away. ? ?Operative Site ? Ok to take outer dressing off tomorrow. You have sutures underneath. Ok to English as a second language teacher. Keep wound clean and dry. No baths or swimming. May apply americaine ointment around incision PRN for pain. ? ?Contact Information: ?If you have questions or concerns, please call our office, [phone], Monday- Thursday 8AM-5PM and Friday 8AM-12Noon.  ?If it is after hours or on the weekend, please call [facility] Main Number, [phone], and ask to speak to the surgeon on call for Dr. Robyne Peers at [facility].  ? ?SPECIFIC COMPLICATIONS TO WATCH FOR: ?Inability to urinate ?Fever over 101? F by mouth ?Nausea and vomiting lasting longer than 24 hours. ?Pain not relieved by medication ordered ?Swelling around the operative site ?Increased redness, warmth, hardness, around operative area ?Numbness, tingling, or cold fingers or toes ?Blood -soaked dressing, (small amounts of oozing may be normal) ?Increasing and progressive drainage from surgical area or exam site ? ?

## 2021-11-04 NOTE — Transfer of Care (Signed)
Immediate Anesthesia Transfer of Care Note ? ?Patient: Richard Mccarty ? ?Procedure(s) Performed: EXAM UNDER ANESTHESIA ANAL (Rectum) ?EXCISION PERIANAL MASS (Rectum) ? ?Patient Location: PACU ? ?Anesthesia Type:General ? ?Level of Consciousness: awake, alert , oriented and patient cooperative ? ?Airway & Oxygen Therapy: Patient Spontanous Breathing and Patient connected to nasal cannula oxygen ? ?Post-op Assessment: Report given to RN, Post -op Vital signs reviewed and stable and Patient moving all extremities ? ?Post vital signs: Reviewed and stable ? ?Last Vitals:  ?Vitals Value Taken Time  ?BP    ?Temp    ?Pulse 92 11/04/21 1426  ?Resp 8 11/04/21 1426  ?SpO2 100 % 11/04/21 1426  ?Vitals shown include unvalidated device data. ? ?Last Pain:  ?Vitals:  ? 11/04/21 1307  ?TempSrc: Oral  ?PainSc: 7   ?   ? ?Patients Stated Pain Goal: 4 (11/04/21 1307) ? ?Complications: No notable events documented. ?

## 2021-11-04 NOTE — Anesthesia Procedure Notes (Signed)
Procedure Name: Intubation ?Date/Time: 11/04/2021 1:42 PM ?Performed by: Myna Bright, CRNA ?Pre-anesthesia Checklist: Patient identified, Emergency Drugs available, Suction available and Patient being monitored ?Patient Re-evaluated:Patient Re-evaluated prior to induction ?Oxygen Delivery Method: Circle system utilized ?Preoxygenation: Pre-oxygenation with 100% oxygen ?Induction Type: IV induction and Rapid sequence ?Laryngoscope Size: Mac and 4 ?Grade View: Grade II ?Tube type: Oral ?Tube size: 7.5 mm ?Number of attempts: 1 ?Airway Equipment and Method: Stylet ?Placement Confirmation: ETT inserted through vocal cords under direct vision, positive ETCO2 and breath sounds checked- equal and bilateral ?Secured at: 22 cm ?Tube secured with: Tape ?Dental Injury: Teeth and Oropharynx as per pre-operative assessment  ? ? ? ? ?

## 2021-11-04 NOTE — Anesthesia Postprocedure Evaluation (Signed)
Anesthesia Post Note ? ?Patient: Richard Mccarty ? ?Procedure(s) Performed: EXAM UNDER ANESTHESIA ANAL (Rectum) ?EXCISION PERIANAL MASS (Rectum) ? ?Patient location during evaluation: Phase II ?Anesthesia Type: General ?Level of consciousness: awake and alert and oriented ?Pain management: pain level controlled ?Vital Signs Assessment: post-procedure vital signs reviewed and stable ?Respiratory status: spontaneous breathing, nonlabored ventilation and respiratory function stable ?Cardiovascular status: blood pressure returned to baseline and stable ?Postop Assessment: no apparent nausea or vomiting ?Anesthetic complications: no ? ? ?No notable events documented. ? ? ?Last Vitals:  ?Vitals:  ? 11/04/21 1515 11/04/21 1530  ?BP: 104/77 95/79  ?Pulse: 69 69  ?Resp: 13 14  ?Temp:    ?SpO2: 96% 96%  ?  ?Last Pain:  ?Vitals:  ? 11/04/21 1530  ?TempSrc:   ?PainSc: 4   ? ? ?  ?  ?  ?  ?  ?  ? ?Adolphe Fortunato C Captola Teschner ? ? ? ? ?

## 2021-11-08 ENCOUNTER — Encounter (HOSPITAL_COMMUNITY): Payer: Self-pay | Admitting: Surgery

## 2021-11-08 LAB — SURGICAL PATHOLOGY

## 2021-11-11 ENCOUNTER — Encounter: Payer: 59 | Admitting: Surgery

## 2022-06-08 ENCOUNTER — Emergency Department (HOSPITAL_COMMUNITY)
Admission: EM | Admit: 2022-06-08 | Discharge: 2022-06-08 | Disposition: A | Discharge status: Home / Self Care | Payer: 59 | Attending: Emergency Medicine | Admitting: Emergency Medicine

## 2022-06-08 ENCOUNTER — Encounter: Payer: Self-pay | Admitting: *Deleted

## 2022-06-08 ENCOUNTER — Encounter (HOSPITAL_COMMUNITY): Payer: Self-pay | Admitting: Emergency Medicine

## 2022-06-08 DIAGNOSIS — R1013 Epigastric pain: Secondary | ICD-10-CM | POA: Insufficient documentation

## 2022-06-08 DIAGNOSIS — R112 Nausea with vomiting, unspecified: Secondary | ICD-10-CM | POA: Insufficient documentation

## 2022-06-08 DIAGNOSIS — R197 Diarrhea, unspecified: Secondary | ICD-10-CM | POA: Insufficient documentation

## 2022-06-08 LAB — CBC WITH DIFFERENTIAL/PLATELET
Abs Immature Granulocytes: 0.04 10*3/uL (ref 0.00–0.07)
Basophils Absolute: 0 10*3/uL (ref 0.0–0.1)
Basophils Relative: 1 %
Eosinophils Absolute: 0.2 10*3/uL (ref 0.0–0.5)
Eosinophils Relative: 3 %
HCT: 47 % (ref 39.0–52.0)
Hemoglobin: 17.1 g/dL — ABNORMAL HIGH (ref 13.0–17.0)
Immature Granulocytes: 1 %
Lymphocytes Relative: 28 %
Lymphs Abs: 2.4 10*3/uL (ref 0.7–4.0)
MCH: 33.7 pg (ref 26.0–34.0)
MCHC: 36.4 g/dL — ABNORMAL HIGH (ref 30.0–36.0)
MCV: 92.7 fL (ref 80.0–100.0)
Monocytes Absolute: 0.8 10*3/uL (ref 0.1–1.0)
Monocytes Relative: 9 %
Neutro Abs: 5 10*3/uL (ref 1.7–7.7)
Neutrophils Relative %: 58 %
Platelets: 218 10*3/uL (ref 150–400)
RBC: 5.07 MIL/uL (ref 4.22–5.81)
RDW: 12 % (ref 11.5–15.5)
WBC: 8.5 10*3/uL (ref 4.0–10.5)
nRBC: 0 % (ref 0.0–0.2)

## 2022-06-08 LAB — COMPREHENSIVE METABOLIC PANEL
ALT: 22 U/L (ref 0–44)
AST: 20 U/L (ref 15–41)
Albumin: 3.9 g/dL (ref 3.5–5.0)
Alkaline Phosphatase: 85 U/L (ref 38–126)
Anion gap: 12 (ref 5–15)
BUN: 11 mg/dL (ref 6–20)
CO2: 29 mmol/L (ref 22–32)
Calcium: 9.2 mg/dL (ref 8.9–10.3)
Chloride: 94 mmol/L — ABNORMAL LOW (ref 98–111)
Creatinine, Ser: 0.96 mg/dL (ref 0.61–1.24)
GFR, Estimated: >60 mL/min (ref >60)
Glucose, Bld: 129 mg/dL — ABNORMAL HIGH (ref 70–99)
Potassium: 3.2 mmol/L — ABNORMAL LOW (ref 3.5–5.1)
Sodium: 135 mmol/L (ref 135–145)
Total Bilirubin: 1 mg/dL (ref 0.3–1.2)
Total Protein: 7.2 g/dL (ref 6.5–8.1)

## 2022-06-08 LAB — LIPASE, BLOOD: Lipase: 28 U/L (ref 11–51)

## 2022-06-08 MED ORDER — SODIUM CHLORIDE 0.9 % IV BOLUS
1000.0000 mL | Freq: Once | INTRAVENOUS | Status: AC
  Administered 2022-06-08: 1000 mL via INTRAVENOUS

## 2022-06-08 MED ORDER — METOCLOPRAMIDE HCL 5 MG/ML IJ SOLN
10.0000 mg | Freq: Once | INTRAMUSCULAR | Status: AC
  Administered 2022-06-08: 10 mg via INTRAVENOUS
  Filled 2022-06-08: qty 2

## 2022-06-08 MED ORDER — ONDANSETRON HCL 4 MG/2ML IJ SOLN
4.0000 mg | Freq: Once | INTRAMUSCULAR | Status: AC
Start: 2022-06-08 — End: 2022-06-08
  Administered 2022-06-08: 4 mg via INTRAVENOUS
  Filled 2022-06-08: qty 2

## 2022-06-08 MED ORDER — PANTOPRAZOLE SODIUM 40 MG PO TBEC: 40.0000 mg | DELAYED_RELEASE_TABLET | Freq: Every day | ORAL | 3 refills | Status: AC

## 2022-06-08 MED ORDER — ONDANSETRON 4 MG PO TBDP: ORAL_TABLET | ORAL | 0 refills | Status: AC

## 2022-06-08 NOTE — ED Triage Notes (Signed)
Pt c/o abd pain with N/V since Monday. Pt seen yesterday at [facility] and eloped.

## 2022-06-08 NOTE — ED Provider Notes (Signed)
[facility] EMERGENCY DEPARTMENT Provider Note   CSN: [id number] Arrival date & time: 06/08/22  [id number]     History  Chief Complaint  Patient presents with   Abdominal Pain    Richard Mccarty is a 36 y.o. male.  Patient presents to the emergency department for evaluation of upper abdominal discomfort with persistent nausea and vomiting.  Symptoms ongoing for 2 days.  He did have diarrhea initially but this has improved.  He has been trying to drink water and Gatorade but feels dehydrated.       Home Medications Prior to Admission medications   Medication Sig Start Date End Date Taking? Authorizing Provider  ondansetron (ZOFRAN-ODT) 4 MG disintegrating tablet 4mg  ODT q4 hours prn nausea/vomit 06/08/22  Yes Mindee Robledo, Gwenyth Allegra, MD  pantoprazole (PROTONIX) 40 MG tablet Take 1 tablet (40 mg total) by mouth daily. 06/08/22  Yes Emberly Tomasso, Gwenyth Allegra, MD  benzocaine (AMERICAINE) 20 % rectal ointment Place rectally every 3 (three) hours as needed for pain. 11/02/21   Pappayliou, Barnetta Chapel A, DO  oxyCODONE (ROXICODONE) 5 MG immediate release tablet Take 1 tablet (5 mg total) by mouth every 6 (six) hours as needed for severe pain. 11/04/21   Pappayliou, Barnetta Chapel A, DO      Allergies    Patient has no known allergies.    Review of Systems   Review of Systems  Physical Exam Updated Vital Signs BP (!) 127/103   Pulse 95   Temp 98.1 F (36.7 C) (Oral)   Resp 17   Ht 5\' 6"  (1.676 m)   Wt 66.7 kg   SpO2 97%   BMI 23.73 kg/m  Physical Exam Vitals and nursing note reviewed.  Constitutional:      General: He is not in acute distress.    Appearance: He is well-developed.  HENT:     Head: Normocephalic and atraumatic.     Mouth/Throat:     Mouth: Mucous membranes are moist.  Eyes:     General: Vision grossly intact. Gaze aligned appropriately.     Extraocular Movements: Extraocular movements intact.     Conjunctiva/sclera: Conjunctivae normal.  Cardiovascular:     Rate and  Rhythm: Regular rhythm. Tachycardia present.     Pulses: Normal pulses.     Heart sounds: Normal heart sounds, S1 normal and S2 normal. No murmur heard.    No friction rub. No gallop.  Pulmonary:     Effort: Pulmonary effort is normal. No respiratory distress.     Breath sounds: Normal breath sounds.  Abdominal:     Palpations: Abdomen is soft.     Tenderness: There is abdominal tenderness in the epigastric area. There is no guarding or rebound.     Hernia: No hernia is present.  Musculoskeletal:        General: No swelling.     Cervical back: Full passive range of motion without pain, normal range of motion and neck supple. No pain with movement, spinous process tenderness or muscular tenderness. Normal range of motion.     Right lower leg: No edema.     Left lower leg: No edema.  Skin:    General: Skin is warm and dry.     Capillary Refill: Capillary refill takes less than 2 seconds.     Findings: No ecchymosis, erythema, lesion or wound.  Neurological:     Mental Status: He is alert and oriented to person, place, and time.     GCS: GCS eye subscore is 4. GCS verbal  subscore is 5. GCS motor subscore is 6.     Cranial Nerves: Cranial nerves 2-12 are intact.     Sensory: Sensation is intact.     Motor: Motor function is intact. No weakness or abnormal muscle tone.     Coordination: Coordination is intact.  Psychiatric:        Mood and Affect: Mood normal.        Speech: Speech normal.        Behavior: Behavior normal.     ED Results / Procedures / Treatments   Labs (all labs ordered are listed, but only abnormal results are displayed) Labs Reviewed  COMPREHENSIVE METABOLIC PANEL - Abnormal; Notable for the following components:      Result Value   Potassium 3.2 (*)    Chloride 94 (*)    Glucose, Bld 129 (*)    All other components within normal limits  LIPASE, BLOOD  CBC WITH DIFFERENTIAL/PLATELET    EKG None  Radiology No results found.  Procedures Procedures     Medications Ordered in ED Medications  sodium chloride 0.9 % bolus 1,000 mL (1,000 mLs Intravenous New Bag/Given 06/08/22 0551)  metoCLOPramide (REGLAN) injection 10 mg (10 mg Intravenous Given 06/08/22 0552)  ondansetron (ZOFRAN) injection 4 mg (4 mg Intravenous Given 06/08/22 0553)    ED Course/ Medical Decision Making/ A&P                           Medical Decision Making Amount and/or Complexity of Data Reviewed Labs: ordered.  Risk Prescription drug management.   Patient presents with epigastric discomfort associated with nausea, vomiting and diarrhea.  Symptoms ongoing for 2 days.  Patient was seen at [facility] for these problems yesterday.  Records were reviewed.  He had blood work which was fairly unremarkable and underwent ultrasound of gallbladder that did not show any acute abnormality.  Patient presents tonight because he is having persistent nausea and vomiting.  He reports that the pain is mostly gone but he still cannot hold anything down.  Patient administered IV fluids and antiemetics.  His examination is reassuring, no significant tenderness or need for repeat imaging.        Final Clinical Impression(s) / ED Diagnoses Final diagnoses:  Epigastric pain  Nausea and vomiting, unspecified vomiting type    Rx / DC Orders ED Discharge Orders          Ordered    ondansetron (ZOFRAN-ODT) 4 MG disintegrating tablet        06/08/22 0617    pantoprazole (PROTONIX) 40 MG tablet  Daily        06/08/22 0617              Orpah Greek, MD 06/10/22 [phone]

## 2022-06-30 ENCOUNTER — Ambulatory Visit: Payer: Self-pay | Admitting: Gastroenterology

## 2022-06-30 NOTE — Progress Notes (Deleted)
GI Office Note    Referring Provider: Orpah Greek Primary Care Physician:  Pcp, No  Primary Gastroenterologist:  Chief Complaint   No chief complaint on file.    History of Present Illness   Richard Mccarty is a 36 y.o. male presenting today at the request of ED provider, Dr. Betsey Holiday for further evaluation of epigastric pain.   In the ED at [facility] October 5: White blood cell count 8500, hemoglobin 17.1, platelets 218,000, potassium 3.2, BUN 11, creatinine 0.96, total bilirubin 1, alk phos 85, AST 20, ALT 22, lipase 28  Seen in the ED at [facility] October 4, labs unremarkable.  Right upper quadrant ultrasound negative.  CT abdomen pelvis with IV contrast only February 27 with no acute intra-abdominal or intrapelvic abnormality.   Medications   Current Outpatient Medications  Medication Sig Dispense Refill   benzocaine (AMERICAINE) 20 % rectal ointment Place rectally every 3 (three) hours as needed for pain. 28.4 g 0   ondansetron (ZOFRAN-ODT) 4 MG disintegrating tablet 104m ODT q4 hours prn nausea/vomit 10 tablet 0   oxyCODONE (ROXICODONE) 5 MG immediate release tablet Take 1 tablet (5 mg total) by mouth every 6 (six) hours as needed for severe pain. 20 tablet 0   pantoprazole (PROTONIX) 40 MG tablet Take 1 tablet (40 mg total) by mouth daily. 30 tablet 3   No current facility-administered medications for this visit.    Allergies   Allergies as of 06/30/2022   (No Known Allergies)    Past Medical History   No past medical history on file.  Past Surgical History   Past Surgical History:  Procedure Laterality Date   MASS EXCISION N/A 11/04/2021   Procedure: EXCISION PERIANAL MASS;  Surgeon: PRusty Aus DO;  Location: [facility];  Service: General;  Laterality: N/A;    Past Family History   No family history on file.  Past Social History   Social History   Socioeconomic History   Marital status: Single    Spouse name: Not on  file   Number of children: Not on file   Years of education: Not on file   Highest education level: Not on file  Occupational History   Not on file  Tobacco Use   Smoking status: Every Day    Types: Cigarettes    Passive exposure: Current   Smokeless tobacco: Never  Vaping Use   Vaping Use: Never used  Substance and Sexual Activity   Alcohol use: Yes   Drug use: Never   Sexual activity: Yes  Other Topics Concern   Not on file  Social History Narrative   Not on file   Social Determinants of Health   Financial Resource Strain: Not on file  Food Insecurity: Not on file  Transportation Needs: Not on file  Physical Activity: Not on file  Stress: Not on file  Social Connections: Not on file  Intimate Partner Violence: Not on file    Review of Systems   General: Negative for anorexia, weight loss, fever, chills, fatigue, weakness. Eyes: Negative for vision changes.  ENT: Negative for hoarseness, difficulty swallowing , nasal congestion. CV: Negative for chest pain, angina, palpitations, dyspnea on exertion, peripheral edema.  Respiratory: Negative for dyspnea at rest, dyspnea on exertion, cough, sputum, wheezing.  GI: See history of present illness. GU:  Negative for dysuria, hematuria, urinary incontinence, urinary frequency, nocturnal urination.  MS: Negative for joint pain, low back pain.  Derm: Negative for rash or itching.  Neuro: Negative for weakness, abnormal sensation, seizure, frequent headaches, memory loss,  confusion.  Psych: Negative for anxiety, depression, suicidal ideation, hallucinations.  Endo: Negative for unusual weight change.  Heme: Negative for bruising or bleeding. Allergy: Negative for rash or hives.  Physical Exam   There were no vitals taken for this visit.   General: Well-nourished, well-developed in no acute distress.  Head: Normocephalic, atraumatic.   Eyes: Conjunctiva pink, no icterus. Mouth: Oropharyngeal mucosa moist and pink , no  lesions erythema or exudate. Neck: Supple without thyromegaly, masses, or lymphadenopathy.  Lungs: Clear to auscultation bilaterally.  Heart: Regular rate and rhythm, no murmurs rubs or gallops.  Abdomen: Bowel sounds are normal, nontender, nondistended, no hepatosplenomegaly or masses,  no abdominal bruits or hernia, no rebound or guarding.   Rectal: *** Extremities: No lower extremity edema. No clubbing or deformities.  Neuro: Alert and oriented x 4 , grossly normal neurologically.  Skin: Warm and dry, no rash or jaundice.   Psych: Alert and cooperative, normal mood and affect.  Labs   *** Imaging Studies   No results found.  Assessment       PLAN   ***   Laureen Ochs. Bobby Rumpf, [place], [facility]
